# Patient Record
Sex: Male | Born: 1990 | Race: White | Hispanic: No | Marital: Single | Smoking: Former smoker
Health system: Southern US, Community
[De-identification: ages and names within clinical notes are randomized; demographics above are authoritative.]

## PROBLEM LIST (undated history)

## (undated) DIAGNOSIS — Q8789 Other specified congenital malformation syndromes, not elsewhere classified: Secondary | ICD-10-CM

## (undated) DIAGNOSIS — H9190 Unspecified hearing loss, unspecified ear: Secondary | ICD-10-CM

## (undated) DIAGNOSIS — J45909 Unspecified asthma, uncomplicated: Secondary | ICD-10-CM

## (undated) HISTORY — DX: Unspecified asthma, uncomplicated: J45.909

## (undated) HISTORY — PX: COCHLEAR IMPLANT: SUR684

## (undated) HISTORY — DX: Other specified congenital malformation syndromes, not elsewhere classified: Q87.89

## (undated) HISTORY — PX: TONSILLECTOMY AND ADENOIDECTOMY: SUR1326

## (undated) HISTORY — PX: OTHER SURGICAL HISTORY: SHX169

## (undated) HISTORY — PX: VASECTOMY: SHX75

## (undated) HISTORY — PX: COLOSTOMY: SHX63

## (undated) HISTORY — DX: Unspecified hearing loss, unspecified ear: H91.90

---
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
## 2011-10-21 DIAGNOSIS — J3489 Other specified disorders of nose and nasal sinuses: Secondary | ICD-10-CM | POA: Insufficient documentation

## 2011-11-21 ENCOUNTER — Other Ambulatory Visit (HOSPITAL_COMMUNITY): Payer: Self-pay | Admitting: Internal Medicine

## 2011-11-21 ENCOUNTER — Ambulatory Visit (HOSPITAL_COMMUNITY)
Admission: RE | Admit: 2011-11-21 | Discharge: 2011-11-21 | Disposition: A | Discharge status: Home / Self Care | Payer: Medicaid Other | Source: Ambulatory Visit | Attending: Internal Medicine | Admitting: Internal Medicine

## 2011-11-21 DIAGNOSIS — R109 Unspecified abdominal pain: Secondary | ICD-10-CM | POA: Insufficient documentation

## 2011-11-21 DIAGNOSIS — R112 Nausea with vomiting, unspecified: Secondary | ICD-10-CM | POA: Insufficient documentation

## 2011-11-21 DIAGNOSIS — R599 Enlarged lymph nodes, unspecified: Secondary | ICD-10-CM | POA: Insufficient documentation

## 2011-11-21 MED ORDER — IOHEXOL 300 MG/ML  SOLN
100.0000 mL | Freq: Once | INTRAMUSCULAR | Status: AC | PRN
  Administered 2011-11-21: 100 mL via INTRAVENOUS

## 2011-11-21 MED ORDER — IOHEXOL 300 MG/ML  SOLN
40.0000 mL | Freq: Once | INTRAMUSCULAR | Status: AC | PRN
  Administered 2011-11-21: 40 mL via ORAL

## 2016-03-26 ENCOUNTER — Ambulatory Visit (INDEPENDENT_AMBULATORY_CARE_PROVIDER_SITE_OTHER): Payer: Medicaid Other | Admitting: Urology

## 2016-03-26 DIAGNOSIS — Z302 Encounter for sterilization: Secondary | ICD-10-CM | POA: Diagnosis not present

## 2016-05-14 ENCOUNTER — Encounter (INDEPENDENT_AMBULATORY_CARE_PROVIDER_SITE_OTHER): Payer: Medicaid Other | Admitting: Urology

## 2016-05-14 DIAGNOSIS — Z302 Encounter for sterilization: Secondary | ICD-10-CM

## 2016-07-09 ENCOUNTER — Ambulatory Visit: Payer: Medicaid Other | Admitting: Urology

## 2016-08-05 ENCOUNTER — Ambulatory Visit: Payer: Medicaid Other | Admitting: Urology

## 2016-08-13 ENCOUNTER — Ambulatory Visit (INDEPENDENT_AMBULATORY_CARE_PROVIDER_SITE_OTHER): Payer: Medicaid Other | Admitting: Urology

## 2016-08-13 DIAGNOSIS — Z302 Encounter for sterilization: Secondary | ICD-10-CM

## 2017-06-25 ENCOUNTER — Encounter: Payer: Self-pay | Admitting: Podiatry

## 2017-06-25 ENCOUNTER — Ambulatory Visit: Payer: Medicaid Other | Admitting: Podiatry

## 2017-06-25 DIAGNOSIS — L600 Ingrowing nail: Secondary | ICD-10-CM

## 2017-06-25 DIAGNOSIS — B351 Tinea unguium: Secondary | ICD-10-CM

## 2017-06-25 NOTE — Progress Notes (Signed)
Subjective:    Patient ID: Mark Cunningham, male    DOB: 10/20/1990, 26 y.o.   MRN: [id number]  HPI  Chief Complaint  Patient presents with  . Ingrown Toenail    Left Hallux, medial side    26 year old male presents the office with his mom for concerns of ingrown toenail to the left big toe mostly on the medial aspect the entire nail is causing pain.  There is redness around the nail.  He was on clindamycin which she finished.  He has been using topical antibiotic ointment to the toe daily.  He states the nail is still painful.  He has no other concerns today.     Review of Systems  Constitutional: Positive for unexpected weight change.  All other systems reviewed and are negative.  He has seen his primary care physician for the unexpected weight change  Past Medical History:  Diagnosis Date  . Johanson-Blizzard syndrome     No past surgical history on file.   Current Outpatient Medications:  .  cephALEXin (KEFLEX) 250 MG capsule, Take 4 (four) times daily by mouth., Disp: , Rfl:  .  mupirocin ointment (BACTROBAN) 2 %, Place 1 application 2 (two) times daily into the nose., Disp: , Rfl:  .  PANCREAZE 4098116800 units CPEP, TAKE 4 CAPSULES 3 TIMES A DAY WITH EVERY MEAL, Disp: , Rfl: 11  Allergies  Allergen Reactions  . Amoxicillin-Pot Clavulanate     Other reaction(s): GI Upset (intolerance)  . Cefazolin     Other reaction(s): GI Upset (intolerance)  . Cefuroxime Axetil     Other reaction(s): GI Upset (intolerance)    Social History   Socioeconomic History  . Marital status: Married    Spouse name: Not on file  . Number of children: Not on file  . Years of education: Not on file  . Highest education level: Not on file  Social Needs  . Financial resource strain: Not on file  . Food insecurity - worry: Not on file  . Food insecurity - inability: Not on file  . Transportation needs - medical: Not on file  . Transportation needs - non-medical: Not on file  Occupational  History  . Not on file  Tobacco Use  . Smoking status: Not on file  Substance and Sexual Activity  . Alcohol use: Not on file  . Drug use: Not on file  . Sexual activity: Not on file  Other Topics Concern  . Not on file  Social History Narrative  . Not on file         Objective:   Physical Exam  General: AAO x3, NAD  Dermatological: The left hallux toenails dystrophic, discolored with yellow to brown discoloration and there is erythema on the entire toenail there is tenderness the nail.  There is induration on both the medial and lateral aspect of the nail.  There is no ascending sialitis.  There is no drainage or pus expressed.  There is no open lesions or pre-ulcerative lesion identified otherwise.  Vascular: Dorsalis Pedis artery and Posterior Tibial artery pedal pulses are 2/4 bilateral with immedate capillary fill time. Pedal hair growth present. No varicosities and no lower extremity edema present bilateral. There is no pain with calf compression, swelling, warmth, erythema.   Neruologic: Grossly intact via light touch bilateral.  Protective threshold with Semmes Wienstein monofilament intact to all pedal sites bilateral.  Musculoskeletal: No gross boney pedal deformities bilateral. No pain, crepitus, or limitation noted with foot and  ankle range of motion bilateral. Muscular strength 5/5 in all groups tested bilateral.  Gait: Unassisted, Nonantalgic.      Assessment & Plan:  26 year old male left hallux ingrown toenail, infection -Treatment options discussed including all alternatives, risks, and complications -Etiology of symptoms were discussed -At this time, recommended total nail removal without chemical matricectomy to the left due to infection. Risks and complications were discussed with the patient for which they understand and  verbally consent to the procedure. Under sterile conditions a total of 3 mL of a mixture of 2% lidocaine plain and 0.5% Marcaine plain was  infiltrated in a hallux block fashion. Once anesthetized, the skin was prepped in sterile fashion. A tourniquet was then applied. Next  the hallux nail was excised making sure to remove the entire offending nail border. Once the nail was  Removed, the area was debrided and the underlying skin was intact. The area was irrigated and hemostasis was obtained.  A dry sterile dressing was applied. After application of the dressing the tourniquet was removed and there is found to be an immediate capillary refill time to the digit. The patient tolerated the procedure well any complications. Post procedure instructions were discussed the patient for which he verbally understood. Follow-up in one week for nail check or sooner if any problems are to arise. Discussed signs/symptoms of worsening infection and directed to call the office immediately should any occur or go directly to the emergency room. In the meantime, encouraged to call the office with any questions, concerns, changes symptoms. -Keflex  Vivi BarrackMatthew R Wagoner DPM

## 2017-06-25 NOTE — Patient Instructions (Signed)

## 2017-07-01 ENCOUNTER — Encounter: Payer: Self-pay | Admitting: Gastroenterology

## 2017-07-06 ENCOUNTER — Encounter: Payer: Self-pay | Admitting: Podiatry

## 2017-07-06 ENCOUNTER — Ambulatory Visit: Payer: Medicaid Other | Admitting: Podiatry

## 2017-07-06 DIAGNOSIS — L600 Ingrowing nail: Secondary | ICD-10-CM

## 2017-07-06 NOTE — Patient Instructions (Signed)

## 2017-07-08 NOTE — Progress Notes (Signed)
Subjective: Mark Cunningham is a 26 y.o.  male returns to office today for follow up evaluation after having left Hallux total nail avulsion performed. Patient has been soaking using epsom salts and applying topical antibiotic covered with bandaid daily. Patient denies fevers, chills, nausea, vomiting. Denies any calf pain, chest pain, SOB.   Presents today with his mother who interprets for him  Objective:  Vitals: Reviewed  General: Well developed, nourished, in no acute distress, alert and oriented x3   Dermatology: Skin is warm, dry and supple bilateral. Left hallux nail bed appears to be clean, dry, with mild granular tissue and surrounding scab. There is no surrounding erythema, edema, drainage/purulence. The remaining nails appear unremarkable at this time. There are no other lesions or other signs of infection present.  Neurovascular status: Intact. No lower extremity swelling; No pain with calf compression bilateral.  Musculoskeletal: Decreased tenderness to palpation of the left hallux nail bed. Muscular strength within normal limits bilateral.   Assesement and Plan: S/p partial nail avulsion, doing well.   -Continue soaking in epsom salts twice a day followed by antibiotic ointment and a band-aid. Can leave uncovered at night. Continue this until completely healed.  -If the area has not healed in 2 weeks, call the office for follow-up appointment, or sooner if any problems arise.  -Monitor for any signs/symptoms of infection. Call the office immediately if any occur or go directly to the emergency room. Call with any questions/concerns.  Ovid CurdMatthew Tunis Gentle, DPM

## 2017-07-10 ENCOUNTER — Encounter: Payer: Self-pay | Admitting: Gastroenterology

## 2017-07-10 ENCOUNTER — Other Ambulatory Visit: Payer: Medicaid Other

## 2017-07-10 ENCOUNTER — Other Ambulatory Visit: Payer: Self-pay

## 2017-07-10 ENCOUNTER — Ambulatory Visit: Payer: Medicaid Other | Admitting: Gastroenterology

## 2017-07-10 VITALS — BP 100/70 | HR 76 | Ht 60.43 in | Wt 94.0 lb

## 2017-07-10 DIAGNOSIS — R197 Diarrhea, unspecified: Secondary | ICD-10-CM

## 2017-07-10 DIAGNOSIS — K8689 Other specified diseases of pancreas: Secondary | ICD-10-CM | POA: Diagnosis not present

## 2017-07-10 MED ORDER — PANCRELIPASE (LIP-PROT-AMYL) 40000-126000 UNITS PO CPEP: 40000.0000 [IU] | ORAL_CAPSULE | ORAL | 0 refills | Status: DC

## 2017-07-10 NOTE — Progress Notes (Signed)
Thank you for sending this case to me and for discussing it in clinic today. I have reviewed the entire note, and the outlined plan is what we discussed.  Please obtain any primary care labs done in the last 6 months.  When he follow up with us soon, the following should be checked if not done do in those primary care labs: Vitamin A, D, E and PT/INR, Vitamin B12, Folic acid, tissue transglutaminase IgA, total IgA level and TSH/free T4  Amada JupiterHenry Danis, MD

## 2017-07-10 NOTE — Progress Notes (Addendum)
07/10/2017 Mark Cunningham [id number] 01/01/1991   HISTORY OF PRESENT ILLNESS: This is a pleasant 26 year old male who is here today with his mother at the request of his PCP, Dr. Phillips Cunningham, for evaluation regarding diarrhea and weight loss.  Patient has Mark Cunningham syndrome.  Pancreatic insufficiency is part of the syndrome.  CT scan in 2013 showed that he has little to no pancreatic tissue.  He has been on pancreatic enzymes his entire life.  He has been on pancreaze [id number] units TID with meals.  The patient is deaf and cannot speak, but does perform sign language.  His mother gives history.  He apparently has been having worse diarrhea recently, sometimes 5-6 times per day, and sometimes waking him from sleep at night.  His weight has always fluctuated, but he is currently at one of the lowest weights that he has been.  She says that their scale at home actually said 90 pounds this morning and just last week he was only weighing 84 pounds.  Some abdominal pain at times, but not always.  Sometimes has small amount of rectal bleeding from wiping when he is having so much diarrhea.  He has had multiple surgeries.  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    Past Medical History:  Diagnosis Date  . Deaf   . Mark Cunningham syndrome    Past Surgical History:  Procedure Laterality Date  . COCHLEAR IMPLANT Right    Doesn't use   . COLOSTOMY    . Dentures    . facial surgeries     many per Mom  . TONSILLECTOMY AND ADENOIDECTOMY     Mom thinks he has had this done, " has had surgeries since day one"  . VASECTOMY      reports that he has been smoking cigarettes.  He has a 0.50 pack-year smoking history. he has never used smokeless tobacco. He reports that he does not drink alcohol or use drugs. family history includes Asthma in his father; Breast cancer in his paternal grandmother; Lung cancer in his  paternal grandfather; Migraines in his mother; Other in his maternal grandfather; Thyroid disease in his maternal grandmother. Allergies  Allergen Reactions  . Amoxicillin-Pot Clavulanate     Other reaction(s): GI Upset (intolerance)  . Cefazolin     Other reaction(s): GI Upset (intolerance)  . Cefuroxime Axetil     Other reaction(s): GI Upset (intolerance)      Outpatient Encounter Medications as of 07/10/2017  Medication Sig  . PANCREAZE [id number] units CPEP TAKE 4 CAPSULES 3 TIMES A DAY WITH EVERY MEAL  . [DISCONTINUED] cephALEXin (KEFLEX) 250 MG capsule Take 4 (four) times daily by mouth.  . [DISCONTINUED] mupirocin ointment (BACTROBAN) 2 % Place 1 application 2 (two) times daily into the nose.   No facility-administered encounter medications on file as of 07/10/2017.      REVIEW OF SYSTEMS  : All other systems reviewed and negative except where noted in the History of Present Illness.   PHYSICAL EXAM: BP 100/70   Pulse 76   Ht 5' 0.43" (1.535 m)   Wt 94 lb (42.6 kg)   BMI 18.10 kg/m  General:  Male of small stature and facial scars/deformity, in no acute distress Head: Normocephalic and atraumatic Eyes:  Sclerae anicteric, conjunctiva pink. Ears: Normal auditory acuity Lungs: Clear throughout to auscultation; no increased WOB. Heart: Regular rate and rhythm; no  M/R/G. Abdomen: Soft, non-distended.  BS present.  Non-tender. Musculoskeletal: Symmetrical with no gross deformities  Skin: No lesions on visible extremities Extremities: No edema  Neurological: Alert oriented x 4, grossly non-focal Psychological:  Alert and cooperative. Normal mood and affect  ASSESSMENT AND PLAN: *26 year old male with Mark Cunningham syndrome who presents here with complaints of worsening diarrhea and weight loss.  Pancreatic insufficiency is part of the syndrome.  CT scan in 2013 showed that he has little to no pancreatic tissue.  He has been on pancreatic enzymes his whole life.  I  discussed with Dr. Myrtie Cunningham.  We are going to check a GI pathogen panel to rule out infectious source due to the acute worsening of his diarrhea.  I am going to change his pancreatic enzymes to Zenpep 80,000 units 3 times daily with meals.  He was given samples of that to try.  His mother will call back in 1 week with an update.  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  Otherwise, there is no reason that he cannot use some antidiarrheals such as Imodium, which he has not done to this point.  Pending his course he may end up needing endoscopic evaluation.  **Received labs from PCP.  CBC, CMP, and TSH were completely normal.     CC:  Mark Cunningham, [name], MD

## 2017-07-10 NOTE — Patient Instructions (Signed)
If you are age 25 or older, your body mass index should be between 23-30. Your Body mass index is 18.1 kg/m. If this is out of the aforementioned range listed, please consider follow up with your Primary Care Provider.  If you are age 25 or younger, your body mass index should be between 19-25. Your Body mass index is 18.1 kg/m. If this is out of the aformentioned range listed, please consider follow up with your Primary Care Provider.   Your physician has requested that you go to the basement for the following lab work before leaving today: GI Pathogen Panel  You have been given Samples of Zenpep - take two capsules three times daily with meals for 8 days.  Call in one week with an update on symptoms.  Ask for nurses Alexia FreestonePatty or Raynelle FanningJulie.  Thank you for choosing me and [facility].   Doug SouJessica Zehr, PA-C

## 2017-07-13 ENCOUNTER — Other Ambulatory Visit: Payer: Medicaid Other

## 2017-07-13 DIAGNOSIS — R197 Diarrhea, unspecified: Secondary | ICD-10-CM

## 2017-07-13 DIAGNOSIS — K8689 Other specified diseases of pancreas: Secondary | ICD-10-CM

## 2017-07-16 ENCOUNTER — Telehealth: Payer: Self-pay | Admitting: Gastroenterology

## 2017-07-16 MED ORDER — PANCRELIPASE (LIP-PROT-AMYL) 40000-126000 UNITS PO CPEP
80000.0000 [IU] | ORAL_CAPSULE | Freq: Three times a day (TID) | ORAL | 6 refills | Status: AC
Start: 2017-07-16 — End: 2017-08-15

## 2017-07-16 NOTE — Telephone Encounter (Signed)
The pt states that the medication is working and would like a prescription sent.  Zenpep has been sent to the pharmacy

## 2017-07-17 LAB — GASTROINTESTINAL PATHOGEN PANEL PCR
C. difficile Tox A/B, PCR: NOT DETECTED
Campylobacter, PCR: NOT DETECTED
Cryptosporidium, PCR: NOT DETECTED
E coli (ETEC) LT/ST PCR: NOT DETECTED
E coli (STEC) stx1/stx2, PCR: NOT DETECTED
E coli 0157, PCR: NOT DETECTED
Giardia lamblia, PCR: NOT DETECTED
Norovirus, PCR: NOT DETECTED
Rotavirus A, PCR: NOT DETECTED
Salmonella, PCR: NOT DETECTED
Shigella, PCR: NOT DETECTED

## 2017-09-02 ENCOUNTER — Ambulatory Visit (INDEPENDENT_AMBULATORY_CARE_PROVIDER_SITE_OTHER): Payer: Medicaid Other | Admitting: Gastroenterology

## 2017-09-02 ENCOUNTER — Encounter: Payer: Self-pay | Admitting: Gastroenterology

## 2017-09-02 VITALS — BP 94/60 | HR 76 | Ht 59.75 in | Wt 105.1 lb

## 2017-09-02 DIAGNOSIS — K529 Noninfective gastroenteritis and colitis, unspecified: Secondary | ICD-10-CM | POA: Diagnosis not present

## 2017-09-02 DIAGNOSIS — K8689 Other specified diseases of pancreas: Secondary | ICD-10-CM | POA: Diagnosis not present

## 2017-09-02 DIAGNOSIS — R634 Abnormal weight loss: Secondary | ICD-10-CM

## 2017-09-02 MED ORDER — PANCRELIPASE (LIP-PROT-AMYL) 40000-126000 UNITS PO CPEP: 3.0000 | ORAL_CAPSULE | Freq: Three times a day (TID) | ORAL | 6 refills | Status: DC

## 2017-09-02 NOTE — Progress Notes (Signed)
     [facility] Progress Note  Chief Complaint: Chronic pancreatic insufficiency, diarrhea and weight loss  Subjective  History:  Aurelio BrashJoey is here today with his mom, who assists with sign language.  The details of his history are outlined in the November 30 consult note by our PA Shanda BumpsJessica.   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  He has had lifelong pancreatic insufficiency.  He came to us with worsening diarrhea and weight loss.  We increased his dose of pancreatic enzyme supplements, and it seems to have helped considerably.  He is down to about 2 bowel movements most days, and he is apparently not needed the Imodium we recommended.  He does note that if he eats a larger amount of food, 2 of the 40,000 unit Zenpep capsules might not be enough to control abdominal cramps or diarrhea. He has chronic gout periumbilic intermittent dull abdominal discomfort. His weight is up at least 10 pounds from the last visit.  ROS: Cardiovascular:  no chest pain Respiratory: no dyspnea  The patient's Past Medical, Family and Social History were reviewed and are on file in the EMR.  Objective:  Med list reviewed  Current Outpatient Medications:  .  Pancrelipase, Lip-Prot-Amyl, (ZENPEP) 40000-126000 units CPEP, Take 3 tablets by mouth 3 (three) times daily with meals., Disp: 270 capsule, Rfl: 6   Vital signs in last 24 hrs: Vitals:   09/02/17 1554  BP: 94/60  Pulse: 76   Weight up from 94 to 105 pounds since 07/10/17  Physical Exam  Evidence of previous head and facial surgery  HEENT: sclera anicteric, oral mucosa moist without lesions  Neck: supple, no thyromegaly, JVD or lymphadenopathy  Cardiac: RRR without murmurs, S1S2 heard, no peripheral edema  Pulm: clear to auscultation bilaterally, normal RR and effort noted  Abdomen: soft, no tenderness, with active bowel sounds. No guarding or palpable hepatosplenomegaly.  Skin; warm  and dry, no jaundice or rash  Recent Labs:  PCP lab reports from October 2018 revealed normal CBC, CMP and TSH  GI pathogen panel negative  @ASSESSMENTPLANBEGIN @ Assessment: Encounter Diagnoses  Name Primary?  . Pancreatic insufficiency Yes  . Abnormal loss of weight   . Chronic diarrhea     Heyden is much improved on sufficient dose of pancreatic enzyme.  As such, I do not think we need to embark on further workup.  He does not appear to need empiric therapy for bacterial overgrowth at this point.  Plan:  I changed his prescription for Zenpep 40k capsules to 3 capsules with each meal, which will give him some dosing flexibility.  He had some concerns and questions about the type and dose of pancreatic enzymes.  All of those were answered to his satisfaction both by using his mother as a sign language interpreter but also typing messages back and forth on his phone. He will see me in 6 months or sooner as needed.  I also encouraged his mother to call us as needed for any problems or questions regarding his medication.  Total time 25 minutes, over half spent in counseling and coordination of care.  Extra time was required due to the communication issues from his hearing impairment.  Charlie PitterHenry L Danis III

## 2017-09-02 NOTE — Patient Instructions (Signed)
If you are age 26 or older, your body mass index should be between 23-30. Your Body mass index is 20.7 kg/m. If this is out of the aforementioned range listed, please consider follow up with your Primary Care Provider.  If you are age 26 or younger, your body mass index should be between 19-25. Your Body mass index is 20.7 kg/m. If this is out of the aformentioned range listed, please consider follow up with your Primary Care Provider.   Please follow up in 6 months.  Thank you for choosing [facility]  Dr Amada JupiterHenry Danis III

## 2018-03-18 ENCOUNTER — Telehealth: Payer: Self-pay

## 2018-03-18 NOTE — Telephone Encounter (Signed)
Spoke to patient's mother, patient reports that today he noticed some blood in his stool, he has had more frequent/loose stools today. He now states it has stopped. Denies any other symptoms. Looking at last ov note, he is due for a 6 month follow up visit. There was a cancellation and have scheduled an office visit on 8/13 with Dr. Myrtie Neitheranis. Instructed patient's mother that if symptoms return or worsen they need to contact the office.

## 2018-03-18 NOTE — Telephone Encounter (Signed)
Agreed.  As needed imodium until then.

## 2018-03-19 NOTE — Telephone Encounter (Signed)
Patient's mother advised to use imodium prn, keep follow up visit.

## 2018-03-23 ENCOUNTER — Ambulatory Visit: Payer: Medicaid Other | Admitting: Gastroenterology

## 2018-03-23 ENCOUNTER — Encounter: Payer: Self-pay | Admitting: Gastroenterology

## 2018-03-23 VITALS — BP 90/70 | HR 80 | Ht 59.75 in | Wt 110.0 lb

## 2018-03-23 DIAGNOSIS — K529 Noninfective gastroenteritis and colitis, unspecified: Secondary | ICD-10-CM | POA: Diagnosis not present

## 2018-03-23 DIAGNOSIS — K8689 Other specified diseases of pancreas: Secondary | ICD-10-CM

## 2018-03-23 DIAGNOSIS — R1033 Periumbilical pain: Secondary | ICD-10-CM

## 2018-03-23 MED ORDER — DICYCLOMINE HCL 10 MG PO CAPS: 10.0000 mg | ORAL_CAPSULE | Freq: Two times a day (BID) | ORAL | 1 refills | Status: DC | PRN

## 2018-03-23 NOTE — Progress Notes (Signed)
[facility] Progress Note  Chief Complaint: Abdominal pain diarrhea, rectal bleeding  Subjective  History:  Kali follows up for his pancreatic insufficiency due to congenital condition described in the original office note November 2018.  As before, history is somewhat limited, even with his mother providing sign language.  It sounds like he eats irregularly, sometimes is up much of the night and sleeps a lot of the day.  He has anywhere from 1-4 BMs per day, perhaps depending upon how much he eats.  Sometimes there is crampy mid abdominal pain that usually occurs with bowel movements.  As near as I can determine, his bowel movements are typically formed.  In the last couple of months he has had some episodes of rectal bleeding that seem to occur more often when he has frequent BMs.  He denies vomiting, loss of appetite or weight loss.  His weight is up another 5 pounds from his last visit with me in January. It sounds like he may be taking 4 capsules of pancreatic enzyme with some meals, even though he was told that should be 2-3. ROS: Cardiovascular:  no chest pain Respiratory: no dyspnea He denies skin rash or joint pain.  The patient's Past Medical, Family and Social History were reviewed and are on file in the EMR.  Objective:  Med list reviewed  Current Outpatient Medications:  .  Pancrelipase, Lip-Prot-Amyl, (ZENPEP) 40000-126000 units CPEP, Take 3 tablets by mouth 3 (three) times daily with meals., Disp: 270 capsule, Rfl: 6 .  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  He has good muscle mass, he is well-appearing.  Hearing impaired HEENT: sclera anicteric, oral mucosa moist without lesions  Neck: supple, no thyromegaly,  JVD or lymphadenopathy  Cardiac: RRR without murmurs, S1S2 heard, no peripheral edema  Pulm: clear to auscultation bilaterally, normal RR and effort noted  Abdomen: soft, no tenderness, with active bowel sounds. No guarding or palpable hepatosplenomegaly.  Skin; warm and dry, no jaundice or rash    @ASSESSMENTPLANBEGIN @ Assessment: Encounter Diagnoses  Name Primary?  . Pancreatic insufficiency Yes  . Chronic diarrhea   . 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  He is now on a sufficient dose of pancreatic enzyme supplements.  He still has variable amounts of diarrhea and some intermittent abdominal pain.  It is difficult to tell if this is from compliance with medicines, the nature of the condition despite treatment, or if there are other digestive issues such as celiac sprue, bacterial overgrowth, IBD or IBS.  There is also been some rectal bleeding that he has attributed to hemorrhoids, but this is uncertain.  Plan: Trial of dicyclomine twice daily Continue pancreatic enzyme supplements, he really should not need more than 2 per meal and one with snacks at his usual caloric intake. EGD and colonoscopy.  He and his mom were agreeable, and she assisted with the explanation of the preparation instructions.  Risks and benefits were reviewed in detail.  The benefits and risks of the planned procedure were described in detail with the patient or (when appropriate) their health care proxy.  Risks were outlined as including, but not limited to,  bleeding, infection, perforation, adverse medication reaction leading to cardiac or pulmonary decompensation, or pancreatitis (if ERCP).  The limitation of incomplete mucosal visualization was also discussed.  No guarantees or warranties were given.     Total time 30 minutes, over half spent face-to-face with patient in counseling and coordination of care.   Charlie PitterHenry L Danis III

## 2018-03-23 NOTE — Patient Instructions (Signed)
If you are age 26 or older, your body mass index should be between 23-30. Your Body mass index is 21.66 kg/m. If this is out of the aforementioned range listed, please consider follow up with your Primary Care Provider.  If you are age 26 or younger, your body mass index should be between 19-25. Your Body mass index is 21.66 kg/m. If this is out of the aformentioned range listed, please consider follow up with your Primary Care Provider.   You have been scheduled for an endoscopy and colonoscopy. Please follow the written instructions given to you at your visit today. Please pick up your prep supplies at the pharmacy within the next 1-3 days. If you use inhalers (even only as needed), please bring them with you on the day of your procedure. Your physician has requested that you go to www.startemmi.com and enter the access code given to you at your visit today. This web site gives a general overview about your procedure. However, you should still follow specific instructions given to you by our office regarding your preparation for the procedure.  It was a pleasure to see you today!  Dr. Myrtie Neitheranis

## 2018-03-25 ENCOUNTER — Ambulatory Visit (AMBULATORY_SURGERY_CENTER): Payer: Medicaid Other | Admitting: Gastroenterology

## 2018-03-25 ENCOUNTER — Encounter: Payer: Self-pay | Admitting: Gastroenterology

## 2018-03-25 VITALS — BP 97/64 | HR 63 | Temp 98.6°F | Resp 11 | Ht 59.0 in | Wt 110.0 lb

## 2018-03-25 DIAGNOSIS — R1033 Periumbilical pain: Secondary | ICD-10-CM

## 2018-03-25 DIAGNOSIS — K529 Noninfective gastroenteritis and colitis, unspecified: Secondary | ICD-10-CM

## 2018-03-25 DIAGNOSIS — K625 Hemorrhage of anus and rectum: Secondary | ICD-10-CM | POA: Diagnosis not present

## 2018-03-25 DIAGNOSIS — K8689 Other specified diseases of pancreas: Secondary | ICD-10-CM

## 2018-03-25 MED ORDER — SODIUM CHLORIDE 0.9 % IV SOLN: 500.0000 mL | Freq: Once | INTRAVENOUS | Status: DC

## 2018-03-25 NOTE — Op Note (Signed)
[facility] Patient Name: Mark Cunningham Procedure Date: 03/25/2018 3:01 PM MRN: [id number] Endoscopist: Sherilyn CooterHenry L. Myrtie Neitheranis , MD Age: [age] Referring MD:  Date of Birth: 12/23/1990 Gender: Male Account #: [id number] Procedure:                Upper GI endoscopy Indications:              Periumbilical abdominal pain, Diarrhea, (chronic                            pancreatitis from congenital absence of pancreas) Medicines:                Monitored Anesthesia Care Procedure:                Pre-Anesthesia Assessment:                           - Prior to the procedure, a History and Physical                            was performed, and patient medications and                            allergies were reviewed. The patient's tolerance of                            previous anesthesia was also reviewed. The risks                            and benefits of the procedure and the sedation                            options and risks were discussed with the patient.                            All questions were answered, and informed consent                            was obtained. Prior Anticoagulants: The patient has                            taken no previous anticoagulant or antiplatelet                            agents. ASA Grade Assessment: II - A patient with                            mild systemic disease. After reviewing the risks                            and benefits, the patient was deemed in                            satisfactory condition to undergo the procedure.  After obtaining informed consent, the endoscope was                            passed under direct vision. Throughout the                            procedure, the patient's blood pressure, pulse, and                            oxygen saturations were monitored continuously. The                            Endoscope was introduced through the mouth, and                            advanced to  the second part of duodenum. The upper                            GI endoscopy was accomplished without difficulty.                            The patient tolerated the procedure well. Scope In: Scope Out: Findings:                 The esophagus was normal.                           Diffuse mucosal changes characterized by atrophy                            were found in the entire examined stomach.                           The cardia and gastric fundus were normal on                            retroflexion.                           The exam of the stomach was otherwise normal except                            for a mucosal depression consistent with a former                            gastrostomy site.                           The examined duodenum was normal. Six biopsies for                            histology were taken with a cold forceps for                            evaluation of celiac disease. Complications:  No immediate complications. Estimated Blood Loss:     Estimated blood loss was minimal. Impression:               - Normal esophagus.                           - Atrophic mucosa in the stomach.                           - Normal examined duodenum. Biopsied. Recommendation:           - Patient has a contact number available for                            emergencies. The signs and symptoms of potential                            delayed complications were discussed with the                            patient. Return to normal activities tomorrow.                            Written discharge instructions were provided to the                            patient.                           - Resume previous diet.                           - Continue present medications.                           - Await pathology results.                           - See the other procedure note for documentation of                            additional recommendations.  L.  Myrtie Neitheranis, MD 03/25/2018 3:33:20 PM This report has been signed electronically.

## 2018-03-25 NOTE — Progress Notes (Signed)
Called to room to assist during endoscopic procedure.  Patient ID and intended procedure confirmed with present staff. Received instructions for my participation in the procedure from the performing physician.  

## 2018-03-25 NOTE — Op Note (Signed)
[facility] Patient Name: Lunette StandsJoey Munoz Procedure Date: 03/25/2018 3:01 PM MRN: [id number] Endoscopist: Sherilyn CooterHenry L. Myrtie Neitheranis , MD Age: [age] Referring MD:  Date of Birth: 02/01/1991 Gender: Male Account #: [id number] Procedure:                Colonoscopy Indications:              Periumbilical abdominal pain, Chronic diarrhea,                            Rectal bleeding Medicines:                Monitored Anesthesia Care Procedure:                Pre-Anesthesia Assessment:                           - Prior to the procedure, a History and Physical                            was performed, and patient medications and                            allergies were reviewed. The patient's tolerance of                            previous anesthesia was also reviewed. The risks                            and benefits of the procedure and the sedation                            options and risks were discussed with the patient.                            All questions were answered, and informed consent                            was obtained. Prior Anticoagulants: The patient has                            taken no previous anticoagulant or antiplatelet                            agents. ASA Grade Assessment: II - A patient with                            mild systemic disease. After reviewing the risks                            and benefits, the patient was deemed in                            satisfactory condition to undergo the procedure.  After obtaining informed consent, the colonoscope                            was passed under direct vision. Throughout the                            procedure, the patient's blood pressure, pulse, and                            oxygen saturations were monitored continuously. The                            Colonoscope was introduced through the anus and                            advanced to the the terminal ileum, with                  identification of the appendiceal orifice and IC                            valve. The colonoscopy was performed without                            difficulty. The patient tolerated the procedure                            well. The quality of the bowel preparation was                            excellent. The terminal ileum, ileocecal valve,                            appendiceal orifice, and rectum were photographed. Scope In: 3:13:00 PM Scope Out: 3:25:04 PM Scope Withdrawal Time: 0 hours 10 minutes 29 seconds  Total Procedure Duration: 0 hours 12 minutes 4 seconds  Findings:                 The perianal exam findings include the appearance                            of a surgically-created anus.                           The terminal ileum appeared normal.                           Normal mucosa was found in the entire colon.                            Biopsies for histology were taken with a cold                            forceps from the right colon and left colon for  evaluation of microscopic colitis.                           Retroflexion in the rectum was not performed due to                            anatomy.                           The exam was otherwise without abnormality. Complications:            No immediate complications. Estimated Blood Loss:     Estimated blood loss was minimal. Impression:               - The appearance of a surgically-created anus found                            on perianal exam.                           - The examined portion of the ileum was normal.                           - Normal mucosa in the entire examined colon.                            Biopsied.                           - The examination was otherwise normal.                           Benign anal bleeding from local tissue irritation                            due to chronic diarrhea. Recommendation:           - Patient has a contact  number available for                            emergencies. The signs and symptoms of potential                            delayed complications were discussed with the                            patient. Return to normal activities tomorrow.                            Written discharge instructions were provided to the                            patient.                           - Resume previous diet.                           -  Continue present medications, including                            pancreatic enzyme supplements and                            recently-prescribed dicyclomine.                           - Await pathology results.                           - No recommendation at this time regarding repeat                            colonoscopy due to young age.                           - Return to my office , to be scheduled after                            pathology complete. Jarl Sellitto L. Myrtie Neither, MD 03/25/2018 3:38:50 PM This report has been signed electronically.

## 2018-03-25 NOTE — Patient Instructions (Signed)
Impression/Recommendations:  Resume previous diet. Continue present medications, including pancreatic enzyme supplements and recently prescribed dicyclomine.  Return to GI office, to be scheduled after pathology complete.  YOU HAD AN ENDOSCOPIC PROCEDURE TODAY AT THE [facility]:   Refer to the procedure report that was given to you for any specific questions about what was found during the examination.  If the procedure report does not answer your questions, please call your gastroenterologist to clarify.  If you requested that your care partner not be given the details of your procedure findings, then the procedure report has been included in a sealed envelope for you to review at your convenience later.  YOU SHOULD EXPECT: Some feelings of bloating in the abdomen. Passage of more gas than usual.  Walking can help get rid of the air that was put into your GI tract during the procedure and reduce the bloating. If you had a lower endoscopy (such as a colonoscopy or flexible sigmoidoscopy) you may notice spotting of blood in your stool or on the toilet paper. If you underwent a bowel prep for your procedure, you may not have a normal bowel movement for a few days.  Please Note:  You might notice some irritation and congestion in your nose or some drainage.  This is from the oxygen used during your procedure.  There is no need for concern and it should clear up in a day or so.  SYMPTOMS TO REPORT IMMEDIATELY:   Following lower endoscopy (colonoscopy or flexible sigmoidoscopy):  Excessive amounts of blood in the stool  Significant tenderness or worsening of abdominal pains  Swelling of the abdomen that is new, acute  Fever of 100F or higher   Following upper endoscopy (EGD)  Vomiting of blood or coffee ground material  New chest pain or pain under the shoulder blades  Painful or persistently difficult swallowing  New shortness of breath  Fever of 100F or higher  Black,  tarry-looking stools  For urgent or emergent issues, a gastroenterologist can be reached at any hour by calling [phone].   DIET:  We do recommend a small meal at first, but then you may proceed to your regular diet.  Drink plenty of fluids but you should avoid alcoholic beverages for 24 hours.  ACTIVITY:  You should plan to take it easy for the rest of today and you should NOT DRIVE or use heavy machinery until tomorrow (because of the sedation medicines used during the test).    FOLLOW UP: Our staff will call the number listed on your records the next business day following your procedure to check on you and address any questions or concerns that you may have regarding the information given to you following your procedure. If we do not reach you, we will leave a message.  However, if you are feeling well and you are not experiencing any problems, there is no need to return our call.  We will assume that you have returned to your regular daily activities without incident.  If any biopsies were taken you will be contacted by phone or by letter within the next 1-3 weeks.  Please call us at [phone] if you have not heard about the biopsies in 3 weeks.    SIGNATURES/CONFIDENTIALITY: You and/or your care partner have signed paperwork which will be entered into your electronic medical record.  These signatures attest to the fact that that the information above on your After Visit Summary has been reviewed and is understood.  Full responsibility of the confidentiality of this discharge information lies with you and/or your care-partner.

## 2018-03-25 NOTE — Progress Notes (Signed)
Report given to PACU, vss 

## 2018-03-26 ENCOUNTER — Telehealth: Payer: Self-pay

## 2018-03-26 NOTE — Telephone Encounter (Signed)
  Follow up Call-  Call back number 03/25/2018  Post procedure Call Back phone  # [phone]  Permission to leave phone message Yes  Some recent data might be hidden     Patient questions:  Do you have a fever, pain , or abdominal swelling? No. Pain Score  0 *  Have you tolerated food without any problems? Yes.    Have you been able to return to your normal activities? Yes.    Do you have any questions about your discharge instructions: Diet   No. Medications  No. Follow up visit  No.  Do you have questions or concerns about your Care? No.  Actions: * If pain score is 4 or above: No action needed, pain <4.

## 2018-03-26 NOTE — Telephone Encounter (Signed)
Called [phone] and left a messaged with your mother we tried to reach pt for a follow up call. maw

## 2018-03-31 ENCOUNTER — Other Ambulatory Visit: Payer: Self-pay

## 2018-04-25 ENCOUNTER — Other Ambulatory Visit: Payer: Self-pay | Admitting: Gastroenterology

## 2018-11-25 ENCOUNTER — Other Ambulatory Visit: Payer: Self-pay | Admitting: Gastroenterology

## 2018-11-25 NOTE — Telephone Encounter (Signed)
Please advise on refill request for Last seen 03-23-2018. Zenpep. Thank you.

## 2018-12-03 ENCOUNTER — Encounter: Payer: Self-pay | Admitting: Gastroenterology

## 2019-03-20 ENCOUNTER — Other Ambulatory Visit: Payer: Self-pay | Admitting: Gastroenterology

## 2019-03-22 NOTE — Telephone Encounter (Signed)
Rec'd refill request for zenpep, which I refilled.  However, it has been a year since he was last seen.  Patient is hearing impaired and has always been accompanied by his mother.  Please contact her to arrange an office visit with me or JZ (who initially saw him)

## 2019-04-17 ENCOUNTER — Emergency Department (HOSPITAL_COMMUNITY)
Admission: EM | Admit: 2019-04-17 | Discharge: 2019-04-17 | Disposition: A | Discharge status: Home / Self Care | Payer: No Typology Code available for payment source | Attending: Emergency Medicine | Admitting: Emergency Medicine

## 2019-04-17 ENCOUNTER — Emergency Department (HOSPITAL_COMMUNITY): Payer: No Typology Code available for payment source

## 2019-04-17 ENCOUNTER — Other Ambulatory Visit: Payer: Self-pay

## 2019-04-17 ENCOUNTER — Encounter (HOSPITAL_COMMUNITY): Payer: Self-pay

## 2019-04-17 DIAGNOSIS — S92334A Nondisplaced fracture of third metatarsal bone, right foot, initial encounter for closed fracture: Secondary | ICD-10-CM | POA: Insufficient documentation

## 2019-04-17 DIAGNOSIS — S92324A Nondisplaced fracture of second metatarsal bone, right foot, initial encounter for closed fracture: Secondary | ICD-10-CM

## 2019-04-17 DIAGNOSIS — J45909 Unspecified asthma, uncomplicated: Secondary | ICD-10-CM | POA: Insufficient documentation

## 2019-04-17 DIAGNOSIS — Y999 Unspecified external cause status: Secondary | ICD-10-CM | POA: Insufficient documentation

## 2019-04-17 DIAGNOSIS — R0789 Other chest pain: Secondary | ICD-10-CM | POA: Insufficient documentation

## 2019-04-17 DIAGNOSIS — S0033XA Contusion of nose, initial encounter: Secondary | ICD-10-CM | POA: Insufficient documentation

## 2019-04-17 DIAGNOSIS — F17210 Nicotine dependence, cigarettes, uncomplicated: Secondary | ICD-10-CM | POA: Insufficient documentation

## 2019-04-17 DIAGNOSIS — S99921A Unspecified injury of right foot, initial encounter: Secondary | ICD-10-CM | POA: Diagnosis present

## 2019-04-17 DIAGNOSIS — Y92410 Unspecified street and highway as the place of occurrence of the external cause: Secondary | ICD-10-CM | POA: Diagnosis not present

## 2019-04-17 DIAGNOSIS — Y9389 Activity, other specified: Secondary | ICD-10-CM | POA: Diagnosis not present

## 2019-04-17 DIAGNOSIS — R109 Unspecified abdominal pain: Secondary | ICD-10-CM | POA: Insufficient documentation

## 2019-04-17 DIAGNOSIS — Z79899 Other long term (current) drug therapy: Secondary | ICD-10-CM | POA: Diagnosis not present

## 2019-04-17 MED ORDER — ACETAMINOPHEN 500 MG PO TABS
1000.0000 mg | ORAL_TABLET | Freq: Once | ORAL | Status: AC
  Administered 2019-04-17: 1000 mg via ORAL
  Filled 2019-04-17: qty 2

## 2019-04-17 MED ORDER — HYDROCODONE-ACETAMINOPHEN 5-325 MG PO TABS: 1.0000 | ORAL_TABLET | ORAL | 0 refills | Status: DC | PRN
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
## 2019-04-20 ENCOUNTER — Ambulatory Visit (INDEPENDENT_AMBULATORY_CARE_PROVIDER_SITE_OTHER): Payer: Medicaid Other | Admitting: Orthopaedic Surgery

## 2019-04-20 ENCOUNTER — Encounter: Payer: Self-pay | Admitting: Orthopaedic Surgery

## 2019-04-20 VITALS — Ht 60.0 in | Wt 130.0 lb

## 2019-04-20 DIAGNOSIS — S92301A Fracture of unspecified metatarsal bone(s), right foot, initial encounter for closed fracture: Secondary | ICD-10-CM | POA: Diagnosis not present

## 2019-04-20 MED ORDER — HYDROCODONE-ACETAMINOPHEN 5-325 MG PO TABS: 1.0000 | ORAL_TABLET | Freq: Two times a day (BID) | ORAL | 0 refills | Status: AC | PRN
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
## 2019-04-28 ENCOUNTER — Ambulatory Visit (INDEPENDENT_AMBULATORY_CARE_PROVIDER_SITE_OTHER): Payer: Medicaid Other

## 2019-04-28 ENCOUNTER — Ambulatory Visit (INDEPENDENT_AMBULATORY_CARE_PROVIDER_SITE_OTHER): Payer: Medicaid Other | Admitting: Orthopaedic Surgery

## 2019-04-28 ENCOUNTER — Encounter: Payer: Self-pay | Admitting: Orthopaedic Surgery

## 2019-04-28 DIAGNOSIS — M79671 Pain in right foot: Secondary | ICD-10-CM

## 2019-04-28 MED ORDER — CEPHALEXIN 500 MG PO CAPS: 500.0000 mg | ORAL_CAPSULE | Freq: Four times a day (QID) | ORAL | 0 refills | Status: DC

## 2019-04-28 NOTE — Progress Notes (Signed)
Office Visit Note   Patient: Mark Cunningham           Date of Birth: 10-19-1990           MRN: [id number] Visit Date: 04/28/2019              Requested by: Sharilyn Sites, [place] [place],  [place] [address] PCP: Sharilyn Sites, MD   Assessment & Plan: Visit Diagnoses:  1. Pain in right foot     Plan: Impression is right foot second and third metatarsal head fractures with early cellulitis of the dorsum of the foot.  The patient will continue weightbearing as tolerated in his cam walker/postoperative shoe.  He will continue to ice and elevate for pain and swelling.  We will start him on 10 days of Keflex.  Should his symptoms worsen or he develop fevers or chills he has been instructed to go to the hospital for IV antibiotics.  Otherwise, follow-up with [place] in 2 weeks time for repeat evaluation and 3 view x-rays of the right foot.  Follow-Up Instructions: Return in about 2 weeks (around 05/12/2019).   Orders:  Orders Placed This Encounter  Procedures  . XR Foot Complete Right   Meds ordered this encounter  Medications  . cephALEXin (KEFLEX) 500 MG capsule    Sig: Take 1 capsule (500 mg total) by mouth 4 (four) times daily.    Dispense:  40 capsule    Refill:  0      Procedures: No procedures performed   Clinical Data: No additional findings.   Subjective: Chief Complaint  Patient presents with  . Right Foot - Follow-up    DOI 04/17/2019    HPI patient is a pleasant 28 year old who presents our clinic today with concerns about his right foot.  He is approximately 11 days out from nondisplaced fractures to the second and third metatarsal heads.  He has been compliant weightbearing in his cam walker and postoperative shoe.  He has been elevating and icing  for the pain and swelling.  Over the past 1 to 2 days he noticed redness to the dorsum of his foot which has increased in diameter quite significantly over the past day.  He has moderate warmth as well.  He  notes that the swelling however has decreased.  No fevers or chills.  Review of Systems as detailed in HPI.  All others reviewed and are negative.   Objective: Vital Signs: There were no vitals taken for this visit.  Physical Exam well-developed and well-nourished gentleman in no acute distress.  Alert and oriented x3.  Ortho Exam examination of his right foot reveals moderate swelling to the dorsal aspect with associated erythema.  No induration.  Moderate tenderness throughout the dorsum of the foot.  Specialty Comments:  No specialty comments available.  Imaging: Xr Foot Complete Right  Result Date: 04/28/2019 X-rays demonstrate second third metatarsal head fractures without interval change    PMFS History: Patient Active Problem List   Diagnosis Date Noted  . Diarrhea 07/10/2017  . Pancreatic insufficiency 07/10/2017  . Other specified disorders of nose and nasal sinuses 10/21/2011   Past Medical History:  Diagnosis Date  . Asthma   . Deaf   . Johanson-Blizzard syndrome     Family History  Problem Relation Age of Onset  . Migraines Mother   . Asthma Father   . Thyroid disease Maternal Grandmother   . Other Maternal Grandfather        ?  throat cancer  . Breast cancer Paternal Grandmother   . Lung cancer Paternal Grandfather     Past Surgical History:  Procedure Laterality Date  . COCHLEAR IMPLANT Right    Doesn't use   . COLOSTOMY    . Dentures    . facial surgeries     many per Mom  . TONSILLECTOMY AND ADENOIDECTOMY     Mom thinks he has had this done, " has had surgeries since day one"  . VASECTOMY     Social History   Occupational History  . Occupation: disabled  Tobacco Use  . Smoking status: Current Every Day Smoker    Packs/day: 0.50    Years: 1.00    Pack years: 0.50    Types: Cigarettes  . Smokeless tobacco: Never Used  Substance and Sexual Activity  . Alcohol use: No    Frequency: Never  . Drug use: No  . Sexual activity: Not on file

## 2019-04-29 ENCOUNTER — Ambulatory Visit: Payer: Medicaid Other | Admitting: Podiatry

## 2019-05-10 ENCOUNTER — Encounter: Payer: Self-pay | Admitting: Gastroenterology

## 2019-05-10 ENCOUNTER — Other Ambulatory Visit: Payer: Self-pay

## 2019-05-10 ENCOUNTER — Ambulatory Visit (INDEPENDENT_AMBULATORY_CARE_PROVIDER_SITE_OTHER): Payer: Medicaid Other | Admitting: Gastroenterology

## 2019-05-10 VITALS — BP 90/60 | HR 80 | Temp 98.1°F | Ht 60.0 in | Wt 130.0 lb

## 2019-05-10 DIAGNOSIS — K529 Noninfective gastroenteritis and colitis, unspecified: Secondary | ICD-10-CM

## 2019-05-10 DIAGNOSIS — K8689 Other specified diseases of pancreas: Secondary | ICD-10-CM | POA: Diagnosis not present

## 2019-05-10 MED ORDER — ZENPEP 40000-126000 UNITS PO CPEP: 3.0000 | ORAL_CAPSULE | Freq: Three times a day (TID) | ORAL | 11 refills | Status: DC
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
## 2019-05-10 NOTE — Patient Instructions (Addendum)
We have sent the following medications to your pharmacy for you to pick up at your convenience: Zenpep  Please follow up with Dr Loletha Carrow in 1 year.  If you are age 27 or older, your body mass index should be between 23-30. Your Body mass index is 25.39 kg/m. If this is out of the aforementioned range listed, please consider follow up with your Primary Care Provider.  If you are age 41 or younger, your body mass index should be between 19-25. Your Body mass index is 25.39 kg/m. If this is out of the aformentioned range listed, please consider follow up with your Primary Care Provider.   It was a pleasure to see you today!  Dr. Loletha Carrow

## 2019-05-11 ENCOUNTER — Ambulatory Visit (INDEPENDENT_AMBULATORY_CARE_PROVIDER_SITE_OTHER): Payer: Medicaid Other | Admitting: Orthopaedic Surgery

## 2019-05-11 ENCOUNTER — Encounter: Payer: Self-pay | Admitting: Orthopaedic Surgery

## 2019-05-11 ENCOUNTER — Ambulatory Visit (INDEPENDENT_AMBULATORY_CARE_PROVIDER_SITE_OTHER): Payer: Medicaid Other

## 2019-05-11 VITALS — Ht 60.0 in | Wt 130.0 lb

## 2019-05-11 DIAGNOSIS — S92301A Fracture of unspecified metatarsal bone(s), right foot, initial encounter for closed fracture: Secondary | ICD-10-CM
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
## 2019-06-01 ENCOUNTER — Ambulatory Visit (INDEPENDENT_AMBULATORY_CARE_PROVIDER_SITE_OTHER): Payer: Medicaid Other

## 2019-06-01 ENCOUNTER — Ambulatory Visit (INDEPENDENT_AMBULATORY_CARE_PROVIDER_SITE_OTHER): Payer: Medicaid Other | Admitting: Orthopaedic Surgery

## 2019-06-01 DIAGNOSIS — S92301A Fracture of unspecified metatarsal bone(s), right foot, initial encounter for closed fracture: Secondary | ICD-10-CM

## 2019-06-01 NOTE — Progress Notes (Signed)
Office Visit Note   Patient: Mark Cunningham           Date of Birth: October 06, 1990           MRN: [id number] Visit Date: 06/01/2019              Requested by: Sharilyn Sites, [place] [place],  [place] [address] PCP: Sharilyn Sites, MD   Assessment & Plan: Visit Diagnoses:  1. Multiple closed fractures of metatarsal bone of right foot, initial encounter     Plan: Impression is approximately 6 weeks status post nondisplaced second and third metatarsal head fractures, right foot.  Patient is progressing nicely.  We will allow him to transition into a regular shoe as tolerated.  We will also go ahead and start him in formal physical therapy.  Help regain motion and stability.  He will follow-up with [place] in 3 to 4 weeks time for repeat evaluation three-view x-rays of the right foot.  Call with concerns or questions in the meantime.  Follow-Up Instructions: Return in about 3 weeks (around 06/22/2019).   Orders:  Orders Placed This Encounter  Procedures  . XR Foot Complete Right  . Ambulatory referral to Physical Therapy   No orders of the defined types were placed in this encounter.     Procedures: No procedures performed   Clinical Data: No additional findings.   Subjective: Chief Complaint  Patient presents with  . Right Foot - Follow-up, Fracture    DOI 04/17/2019    HPI patient is a pleasant 28 year old gentleman who presents our clinic today approximately 6 weeks status post right foot second and third metatarsal head fractures, date of injury 04/17/2019.  He has been doing well.  He has been alternating wearing his postoperative shoe and cam walker.  His pain has significantly improved.  He is taking Aleve as needed.  Overall, doing well.     Objective: Vital Signs: There were no vitals taken for this visit.   Ortho Exam examination of his right foot reveals minimal swelling.  Minimal tenderness to the second and third metatarsal heads.  Slightly limited range  of motion secondary to stiffness.  He is neurovascularly intact distally.  Specialty Comments:  No specialty comments available.  Imaging: Xr Foot Complete Right  Result Date: 06/01/2019 X-rays reveal nearly healed second third metatarsal head fractures    PMFS History: Patient Active Problem List   Diagnosis Date Noted  . Diarrhea 07/10/2017  . Pancreatic insufficiency 07/10/2017  . Other specified disorders of nose and nasal sinuses 10/21/2011   Past Medical History:  Diagnosis Date  . Asthma   . Deaf   . Johanson-Blizzard syndrome     Family History  Problem Relation Age of Onset  . Migraines Mother   . Asthma Father   . Thyroid disease Maternal Grandmother   . Other Maternal Grandfather        ? throat cancer  . Breast cancer Paternal Grandmother   . Lung cancer Paternal Grandfather     Past Surgical History:  Procedure Laterality Date  . COCHLEAR IMPLANT Right    Doesn't use   . COLOSTOMY    . Dentures    . facial surgeries     many per Mom  . TONSILLECTOMY AND ADENOIDECTOMY     Mom thinks he has had this done, " has had surgeries since day one"  . VASECTOMY     Social History   Occupational History  . Occupation: disabled  Tobacco Use  . Smoking status: Current Every Day Smoker    Packs/day: 0.50    Years: 1.00    Pack years: 0.50    Types: Cigarettes  . Smokeless tobacco: Never Used  Substance and Sexual Activity  . Alcohol use: No    Frequency: Never  . Drug use: No  . Sexual activity: Not on file

## 2019-06-22 ENCOUNTER — Ambulatory Visit: Payer: Medicaid Other | Admitting: Orthopaedic Surgery

## 2019-06-28 ENCOUNTER — Ambulatory Visit (INDEPENDENT_AMBULATORY_CARE_PROVIDER_SITE_OTHER): Payer: Medicaid Other | Admitting: Orthopaedic Surgery

## 2019-06-28 ENCOUNTER — Other Ambulatory Visit: Payer: Self-pay

## 2019-06-28 ENCOUNTER — Ambulatory Visit (INDEPENDENT_AMBULATORY_CARE_PROVIDER_SITE_OTHER): Payer: Medicaid Other

## 2019-06-28 DIAGNOSIS — S92301A Fracture of unspecified metatarsal bone(s), right foot, initial encounter for closed fracture: Secondary | ICD-10-CM

## 2019-06-28 DIAGNOSIS — M79671 Pain in right foot: Secondary | ICD-10-CM

## 2019-06-28 NOTE — Progress Notes (Signed)
   Office Visit Note   Patient: Mark Cunningham           Date of Birth: 09/29/1990           MRN: [id number] Visit Date: 06/28/2019              Requested by: Sharilyn Sites, [address] [place],  [place] [address] PCP: Sharilyn Sites, MD   Assessment & Plan: Visit Diagnoses:  1. Multiple closed fractures of metatarsal bone of right foot, initial encounter   2. Pain in right foot     Plan: Impression is healed second and third metatarsal fractures.  At this point he is released to activity as tolerated.  Questions encouraged and answered.  Follow-up as needed.  Follow-Up Instructions: Return if symptoms worsen or fail to improve.   Orders:  Orders Placed This Encounter  Procedures  . XR Foot Complete Right   No orders of the defined types were placed in this encounter.     Procedures: No procedures performed   Clinical Data: No additional findings.   Subjective: Chief Complaint  Patient presents with  . Right Foot - Follow-up    Mark Cunningham follows up today for his metatarsal fractures.  He is accompanied by his mother.  He is doing well and not take anything for pain.  He is walking well.  Denies any numbness and tingling.     Review of Systems   Objective: Vital Signs: There were no vitals taken for this visit.  Physical Exam  Ortho Exam Right foot exam is unremarkable.  No tenderness palpation.  No swelling. Specialty Comments:  No specialty comments available.  Imaging: Xr Foot Complete Right  Result Date: 06/28/2019 Healed 2nd and 3rd metatarsal neck fractures    PMFS History: Patient Active Problem List   Diagnosis Date Noted  . Diarrhea 07/10/2017  . Pancreatic insufficiency 07/10/2017  . Other specified disorders of nose and nasal sinuses 10/21/2011   Past Medical History:  Diagnosis Date  . Asthma   . Deaf   . Johanson-Blizzard syndrome     Family History  Problem Relation Age of Onset  . Migraines Mother   . Asthma Father    . Thyroid disease Maternal Grandmother   . Other Maternal Grandfather        ? throat cancer  . Breast cancer Paternal Grandmother   . Lung cancer Paternal Grandfather     Past Surgical History:  Procedure Laterality Date  . COCHLEAR IMPLANT Right    Doesn't use   . COLOSTOMY    . Dentures    . facial surgeries     many per Mom  . TONSILLECTOMY AND ADENOIDECTOMY     Mom thinks he has had this done, " has had surgeries since day one"  . VASECTOMY     Social History   Occupational History  . Occupation: disabled  Tobacco Use  . Smoking status: Current Every Day Smoker    Packs/day: 0.50    Years: 1.00    Pack years: 0.50    Types: Cigarettes  . Smokeless tobacco: Never Used  Substance and Sexual Activity  . Alcohol use: No    Frequency: Never  . Drug use: No  . Sexual activity: Not on file

## 2020-06-02 ENCOUNTER — Other Ambulatory Visit: Payer: Self-pay | Admitting: Gastroenterology

## 2020-11-11 ENCOUNTER — Other Ambulatory Visit: Payer: Self-pay | Admitting: Gastroenterology
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
## 2021-07-22 ENCOUNTER — Other Ambulatory Visit: Payer: Self-pay | Admitting: Gastroenterology

## 2021-07-22 NOTE — Telephone Encounter (Signed)
Patient called requesting a refill of Zenpep.  He is almost out of his medication and would like it to be called in as soon as you can for him.  Thank you.

## 2021-07-23 NOTE — Telephone Encounter (Signed)
I will refill medicine. Patient last seen Sept 2020 and needs clinic follow up with me.  Please call his mother to arrange (patient is deaf)  - HD

## 2021-09-15 ENCOUNTER — Other Ambulatory Visit: Payer: Self-pay | Admitting: Gastroenterology

## 2021-09-19 ENCOUNTER — Ambulatory Visit (INDEPENDENT_AMBULATORY_CARE_PROVIDER_SITE_OTHER): Payer: Medicare Other | Admitting: Gastroenterology

## 2021-09-19 ENCOUNTER — Encounter: Payer: Self-pay | Admitting: Gastroenterology

## 2021-09-19 VITALS — BP 100/78 | HR 80

## 2021-09-19 DIAGNOSIS — K8689 Other specified diseases of pancreas: Secondary | ICD-10-CM | POA: Diagnosis not present

## 2021-09-19 MED ORDER — ZENPEP 40000-126000 UNITS PO CPEP: 3.0000 | ORAL_CAPSULE | Freq: Three times a day (TID) | ORAL | 11 refills | Status: DC

## 2021-09-19 NOTE — Patient Instructions (Signed)
If you are age 30 or older, your body mass index should be between 23-30. Your There is no height or weight on file to calculate BMI. If this is out of the aforementioned range listed, please consider follow up with your Primary Care Provider.  If you are age 64 or younger, your body mass index should be between 19-25. Your There is no height or weight on file to calculate BMI. If this is out of the aformentioned range listed, please consider follow up with your Primary Care Provider.   __________________________________________________________  The [facility] providers would like to encourage you to use MYCHART to communicate with providers for non-urgent requests or questions.  Due to long hold times on the telephone, sending your provider a message by MYCHART may be a faster and more efficient way to get a response.  Please allow 48 business hours for a response.  Please remember that this is for non-urgent requests.    It was a pleasure to see you today!  Thank you for trusting me with your gastrointestinal care!     

## 2021-09-19 NOTE — Progress Notes (Signed)
° ° ° °   GI Progress Note  Chief Complaint: Exocrine pancreatic insufficiency  Subjective  History: From last office note September 2020: "Congenital pancreatic insufficiency, the enzyme dosing appears to be adequate.   He has some diarrhea but it is manageable with low-dose Imodium.  Episodes of urgency and small-volume incontinence are related to surgically altered anal canal and lack of normal sphincter function.   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  Clinical details in prior notes.  Mark Cunningham's mother was present for today's visit, and the video phone ASL interpreter was also employed. He is glad to report feeling well overall.  He takes 3 pancreatic enzyme capsules with most meals, sometimes 2 or 1 if it is a smaller portion or snack.  He has occasional loose stools that he attributes to dietary indiscretion such as fatty or sweet things (he loves cupcakes).  His weight appears up since I last saw him.  He will take an occasional OTC nausea medicine or Imodium, says those work well and does not presently feel the need for anything prescription after we talked about that.  ROS: Cardiovascular:  no chest pain Respiratory: no dyspnea  The patient's Past Medical, Family and Social History were reviewed and are on file in the EMR.  Objective:  Med list reviewed  Current Outpatient Medications:    HYDROcodone-acetaminophen (NORCO) 5-325 MG tablet, Take 1 tablet by mouth 2 (two) times daily as needed for moderate pain., Disp: 20 tablet, Rfl: 0   Pancrelipase, Lip-Prot-Amyl, (ZENPEP) 40000-126000 units CPEP, Take 3 capsules by mouth 3 (three) times daily with meals., Disp: 270 capsule, Rfl: 11   Vital signs in  last 24 hrs: Vitals:   09/19/21 1019  BP: 100/78  Pulse: 80   Wt Readings from Last 3 Encounters:  05/11/19 130 lb (59 kg)  05/10/19 130 lb (59 kg)  04/20/19 130 lb (59 kg)    Physical Exam  Well-appearing, good muscle mass Cardiac: RRR without murmurs, S1S2 heard, no peripheral edema Pulm: clear to auscultation bilaterally, normal RR and effort noted Abdomen: soft, no tenderness, with active bowel sounds. No guarding or palpable hepatosplenomegaly. Skin; warm and dry, no jaundice or rash  Labs:   ___________________________________________ Radiologic studies:   ____________________________________________ Other:   _____________________________________________ Assessment & Plan  Assessment: Encounter Diagnosis  Name Primary?   Pancreatic insufficiency Yes   Mark Cunningham is doing very well overall, takes his medicine regularly, has good control of symptoms and is happy with how he is doing.  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  He is planning to shop around for insurance depending on what sort of work he can get.  I encouraged him to keep up with his treatment as he is currently doing, I refilled his medicine for a year supply I will be glad to see him sooner if needed.  30 minutes were spent on this encounter (including chart review, history/exam, counseling/coordination of care, and documentation) > 50% of that time was spent on counseling and coordination of care.  Extra time required for use of interpreter service.  Mark Cunningham

## 2022-10-20 ENCOUNTER — Other Ambulatory Visit: Payer: Self-pay | Admitting: Gastroenterology

## 2022-10-30 ENCOUNTER — Other Ambulatory Visit: Payer: Self-pay | Admitting: Gastroenterology

## 2022-10-31 ENCOUNTER — Telehealth: Payer: Self-pay | Admitting: Gastroenterology

## 2022-10-31 NOTE — Telephone Encounter (Signed)
PT mother is calling to get refill on zenpep. Pharmacist said it would need an approval. It needs to be sent to [facility] in [place]

## 2022-11-03 ENCOUNTER — Other Ambulatory Visit (HOSPITAL_COMMUNITY): Payer: Self-pay

## 2022-11-03 NOTE — Telephone Encounter (Signed)
Patient now has Medicare D plan- attempted PA- no PA is required. Ran test claim, claim processed. Called pharmacy, they also were able to process claim.

## 2023-04-29 ENCOUNTER — Other Ambulatory Visit: Payer: Self-pay | Admitting: Gastroenterology

## 2023-05-12 ENCOUNTER — Other Ambulatory Visit: Payer: Self-pay | Admitting: Gastroenterology

## 2023-05-12 ENCOUNTER — Telehealth: Payer: Self-pay | Admitting: Gastroenterology

## 2023-05-12 MED ORDER — ZENPEP 40000-126000 UNITS PO CPEP: 3.0000 | ORAL_CAPSULE | Freq: Three times a day (TID) | ORAL | 1 refills | Status: DC

## 2023-05-12 NOTE — Telephone Encounter (Signed)
Follow up scheduled for November.  Refills have been filled until the appointment.

## 2023-05-12 NOTE — Telephone Encounter (Signed)
Inbound call from patient stating he needs a refill of Zenpep. States he will be out soon. Patient requesting a call back to discuss refill. Please advise, thank you.

## 2023-06-12 ENCOUNTER — Ambulatory Visit: Payer: Medicare Other | Admitting: Gastroenterology

## 2023-06-12 NOTE — Progress Notes (Deleted)
[facility] progress note:  History: Mark Cunningham 06/12/2023  Referring provider: Assunta Found, MD  Reason for consult/chief complaint: No chief complaint on file.   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  Clinical details in prior notes.   [name]'s mother was present for today's visit, and the video phone ASL interpreter was also employed. He is glad to report feeling well overall.  He takes 3 pancreatic enzyme capsules with most meals, sometimes 2 or 1 if it is a smaller portion or snack.  He has occasional loose stools that he attributes to dietary indiscretion such as fatty or sweet things (he loves cupcakes).  His weight appears up since I last saw him.  He will take an occasional OTC nausea medicine or Imodium, says those work well and does not presently feel the need for anything prescription after we talked about that." __________________  ***   ROS:  Review of Systems   Past Medical History: Past Medical History:  Diagnosis Date   Asthma    Deaf    Mark Cunningham syndrome      Past Surgical History: Past Surgical History:  Procedure Laterality Date   COCHLEAR IMPLANT Right    Doesn't use    COLOSTOMY     Dentures     facial surgeries     many per Mom   TONSILLECTOMY AND ADENOIDECTOMY     Mom thinks he has had this done, " has had surgeries since day one"   VASECTOMY       Family History: Family History  Problem Relation Age of Onset   Migraines Mother    Asthma Father    Thyroid disease Maternal Grandmother    Other Maternal Grandfather        ? throat cancer   Breast cancer Paternal Grandmother    Lung cancer Paternal Grandfather    Colon cancer Neg Hx      Social History: Social History   Socioeconomic History   Marital status: Single    Spouse name: Not on file   Number of children: Not on file   Years of education: Not on file   Highest education level: Not on file  Occupational History   Occupation: disabled  Tobacco Use   Smoking status: Former    Current packs/day: 0.50    Average packs/day: 0.5 packs/day for 1 year (0.5 ttl pk-yrs)    Types: Cigarettes   Smokeless tobacco: Never   Tobacco comments:    Patient quit 2 years ago  Vaping Use   Vaping status: Never Used  Substance and Sexual Activity   Alcohol use: Not Currently   Drug use: No   Sexual activity: Not on file  Other Topics Concern   Not on file  Social History Narrative   Not on file   Social Determinants of Health   Financial Resource Strain: Not on file  Food Insecurity: Not on file  Transportation Needs: Not on file  Physical Activity: Not on file  Stress: Not on file  Social Connections: Not on file    Allergies: Allergies  Allergen Reactions   Amoxicillin-Pot Clavulanate     Other reaction(s): GI Upset (intolerance)   Cefazolin     Other reaction(s): GI Upset (intolerance)   Cefuroxime Axetil  Other reaction(s): GI Upset (intolerance)    Outpatient Meds: Current Outpatient Medications  Medication Sig Dispense Refill   HYDROcodone-acetaminophen (NORCO) 5-325 MG tablet Take 1 tablet by mouth 2 (two) times daily as needed for moderate pain. 20 tablet 0   Pancrelipase, Lip-Prot-Amyl, (ZENPEP) 40000-126000 units CPEP Take 3 capsules (120,000 Units total) by mouth 3 (three) times daily with meals. 270 capsule 1   No current facility-administered medications for this visit.      ___________________________________________________________________ Objective   Exam:  There were no vitals taken for this visit. Wt Readings from Last 3 Encounters:  05/11/19 130 lb (59 kg)  05/10/19 130 lb (59 kg)  04/20/19 130 lb (59 kg)     General: ***  Eyes: sclera anicteric, no redness ENT: oral mucosa moist without lesions, no cervical or supraclavicular lymphadenopathy CV: ***, no JVD, no peripheral edema Resp: clear to auscultation bilaterally, normal RR and effort noted GI: soft, *** tenderness, with active bowel sounds. No guarding or palpable organomegaly noted. Skin; warm and dry, no rash or jaundice noted Neuro: awake, alert and oriented x 3. Normal gross motor function and fluent speech  Labs:  ***  Radiologic Studies:  ***  Assessment: No diagnosis found.  ***  Plan:  ***  Thank you for the courtesy of this consult.  Please call me with any questions or concerns.  Charlie Pitter III  CC: Referring provider noted above

## 2023-07-22 ENCOUNTER — Other Ambulatory Visit: Payer: Self-pay | Admitting: Gastroenterology

## 2023-09-22 ENCOUNTER — Ambulatory Visit: Payer: Medicare Other | Admitting: Gastroenterology

## 2023-09-22 NOTE — Progress Notes (Deleted)
     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  Clinical details in prior notes.  Clinically well on pancreatic enzyme supplements (Zenpep) when seen in February 2023. No-show for same-day cancel for clinic visit 08/22/2023 and no-show on 09/22/2023 Discussed the use of AI scribe software for clinical note transcription with the patient, who gave verbal consent to proceed.  History of Present Illness            ROS: Cardiovascular:  no chest pain Respiratory: no dyspnea  The patient's Past Medical, Family and Social History were reviewed and are on file in the EMR. Past Medical History:  Diagnosis Date   Asthma    Deaf    Johanson-Blizzard syndrome     Past Surgical History:  Procedure Laterality Date   COCHLEAR IMPLANT Right    Doesn't use    COLOSTOMY     Dentures     facial surgeries     many per Mom   TONSILLECTOMY AND ADENOIDECTOMY     Mom thinks he has had this done, " has had surgeries since day one"   VASECTOMY       Objective:  Med list reviewed  Current Outpatient Medications:    HYDROcodone-acetaminophen (NORCO) 5-325 MG tablet, Take 1 tablet by mouth 2 (two) times daily as needed for moderate pain., Disp: 20 tablet, Rfl: 0   ZENPEP 40000-126000 units CPEP, TAKE 3 CAPSULES BY MOUTH 3 TIMES DAILY WITH MEALS., Disp: 270 capsule, Rfl: 1   Vital signs in last 24 hrs: There were no vitals filed for this visit. Wt Readings from Last 3 Encounters:  05/11/19 130 lb (59 kg)  05/10/19 130 lb (59 kg)  04/20/19 130 lb (59 kg)    Physical Exam  *** HEENT: sclera anicteric, oral mucosa moist without lesions Neck: supple, no thyromegaly, JVD or  lymphadenopathy Cardiac: ***,  no peripheral edema Pulm: clear to auscultation bilaterally, normal RR and effort noted Abdomen: soft, *** tenderness, with active bowel sounds. No guarding or palpable hepatosplenomegaly. Skin; warm and dry, no jaundice or rash   Labs:   ___________________________________________ Radiologic studies:   ____________________________________________ Other:   _____________________________________________   No diagnosis found.  Assessment and Plan              Plan:   *** minutes were spent on this encounter (including chart review, history/exam, counseling/coordination of care, and documentation) > 50% of that time was spent on counseling and coordination of care.   Charlie Pitter III
